# Patient Record
Sex: Female | Born: 1955 | Race: White | Hispanic: No | Marital: Married | Smoking: Former smoker
Health system: Southern US, Community
[De-identification: ages and names within clinical notes are randomized; demographics above are authoritative.]

## PROBLEM LIST (undated history)

## (undated) DIAGNOSIS — M8580 Other specified disorders of bone density and structure, unspecified site: Secondary | ICD-10-CM

## (undated) DIAGNOSIS — K635 Polyp of colon: Secondary | ICD-10-CM

## (undated) HISTORY — DX: Other specified disorders of bone density and structure, unspecified site: M85.80

## (undated) HISTORY — DX: Polyp of colon: K63.5

## (undated) HISTORY — PX: MOUTH SURGERY: SHX715

---
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
## 1998-02-18 ENCOUNTER — Other Ambulatory Visit: Admission: RE | Admit: 1998-02-18 | Discharge: 1998-02-18 | Payer: Self-pay | Admitting: Gynecology

## 1999-02-02 ENCOUNTER — Other Ambulatory Visit: Admission: RE | Admit: 1999-02-02 | Discharge: 1999-02-02 | Payer: Self-pay | Admitting: Gynecology

## 2000-06-29 ENCOUNTER — Other Ambulatory Visit: Admission: RE | Admit: 2000-06-29 | Discharge: 2000-06-29 | Payer: Self-pay | Admitting: Gynecology

## 2000-07-08 ENCOUNTER — Encounter: Admission: RE | Admit: 2000-07-08 | Discharge: 2000-07-08 | Payer: Self-pay | Admitting: Gynecology

## 2000-07-08 ENCOUNTER — Encounter: Payer: Self-pay | Admitting: Gynecology

## 2003-07-11 ENCOUNTER — Other Ambulatory Visit: Admission: RE | Admit: 2003-07-11 | Discharge: 2003-07-11 | Payer: Self-pay | Admitting: Gynecology

## 2003-07-26 ENCOUNTER — Encounter: Payer: Self-pay | Admitting: Gynecology

## 2003-07-26 ENCOUNTER — Encounter: Admission: RE | Admit: 2003-07-26 | Discharge: 2003-07-26 | Payer: Self-pay | Admitting: Gynecology

## 2008-10-25 DIAGNOSIS — M8580 Other specified disorders of bone density and structure, unspecified site: Secondary | ICD-10-CM

## 2008-10-25 HISTORY — DX: Other specified disorders of bone density and structure, unspecified site: M85.80

## 2009-03-06 ENCOUNTER — Encounter: Payer: Self-pay | Admitting: Women's Health

## 2009-03-06 ENCOUNTER — Other Ambulatory Visit: Admission: RE | Admit: 2009-03-06 | Discharge: 2009-03-06 | Payer: Self-pay | Admitting: Gynecology

## 2009-03-06 ENCOUNTER — Ambulatory Visit: Payer: Self-pay | Admitting: Women's Health

## 2009-03-25 DIAGNOSIS — K635 Polyp of colon: Secondary | ICD-10-CM | POA: Insufficient documentation

## 2009-03-25 HISTORY — DX: Polyp of colon: K63.5

## 2009-03-31 ENCOUNTER — Ambulatory Visit: Payer: Self-pay | Admitting: Women's Health

## 2009-09-15 ENCOUNTER — Encounter: Admission: RE | Admit: 2009-09-15 | Discharge: 2009-09-15 | Payer: Self-pay | Admitting: Gynecology

## 2009-09-25 ENCOUNTER — Encounter: Admission: RE | Admit: 2009-09-25 | Discharge: 2009-09-25 | Payer: Self-pay | Admitting: Gynecology

## 2009-10-25 HISTORY — PX: HERNIA REPAIR: SHX51

## 2010-02-06 ENCOUNTER — Encounter: Admission: RE | Admit: 2010-02-06 | Discharge: 2010-02-06 | Payer: Self-pay | Admitting: General Surgery

## 2010-02-11 ENCOUNTER — Ambulatory Visit (HOSPITAL_BASED_OUTPATIENT_CLINIC_OR_DEPARTMENT_OTHER): Admission: RE | Admit: 2010-02-11 | Discharge: 2010-02-11 | Payer: Self-pay | Admitting: General Surgery

## 2010-03-09 ENCOUNTER — Ambulatory Visit: Payer: Self-pay | Admitting: Women's Health

## 2010-03-09 ENCOUNTER — Other Ambulatory Visit: Admission: RE | Admit: 2010-03-09 | Discharge: 2010-03-09 | Payer: Self-pay | Admitting: Gynecology

## 2010-06-25 HISTORY — PX: BREAST SURGERY: SHX581

## 2010-06-30 ENCOUNTER — Encounter: Admission: RE | Admit: 2010-06-30 | Discharge: 2010-06-30 | Payer: Self-pay | Admitting: Specialist

## 2010-07-20 ENCOUNTER — Ambulatory Visit (HOSPITAL_BASED_OUTPATIENT_CLINIC_OR_DEPARTMENT_OTHER): Admission: RE | Admit: 2010-07-20 | Discharge: 2010-07-21 | Payer: Self-pay | Admitting: Specialist

## 2011-01-07 LAB — BASIC METABOLIC PANEL
Calcium: 8.9 mg/dL (ref 8.4–10.5)
Creatinine, Ser: 0.77 mg/dL (ref 0.4–1.2)

## 2011-01-07 LAB — DIFFERENTIAL
Basophils Absolute: 0.1 10*3/uL (ref 0.0–0.1)
Lymphocytes Relative: 23 % (ref 12–46)
Neutro Abs: 4.9 10*3/uL (ref 1.7–7.7)

## 2011-01-07 LAB — CBC
Platelets: 258 10*3/uL (ref 150–400)
RDW: 12.6 % (ref 11.5–15.5)
WBC: 7.5 10*3/uL (ref 4.0–10.5)

## 2011-01-12 LAB — DIFFERENTIAL
Eosinophils Absolute: 0.1 10*3/uL (ref 0.0–0.7)
Lymphocytes Relative: 32 % (ref 12–46)
Lymphs Abs: 1.6 10*3/uL (ref 0.7–4.0)
Monocytes Relative: 10 % (ref 3–12)
Neutrophils Relative %: 56 % (ref 43–77)

## 2011-01-12 LAB — BASIC METABOLIC PANEL
CO2: 28 mEq/L (ref 19–32)
Calcium: 8.8 mg/dL (ref 8.4–10.5)
Chloride: 106 mEq/L (ref 96–112)
Sodium: 139 mEq/L (ref 135–145)

## 2011-01-12 LAB — CBC
HCT: 42.8 % (ref 36.0–46.0)
MCV: 88.5 fL (ref 78.0–100.0)
RBC: 4.83 MIL/uL (ref 3.87–5.11)
WBC: 5.2 10*3/uL (ref 4.0–10.5)

## 2011-04-15 IMAGING — CR DG CHEST 2V
2 series · 2 of 2 positions shown · non-contrast
Comparison: None.

CLINICAL DATA: Preop respiratory exam.  Inguinal hernia.

CHEST - 2 VIEW

[w chest pa]
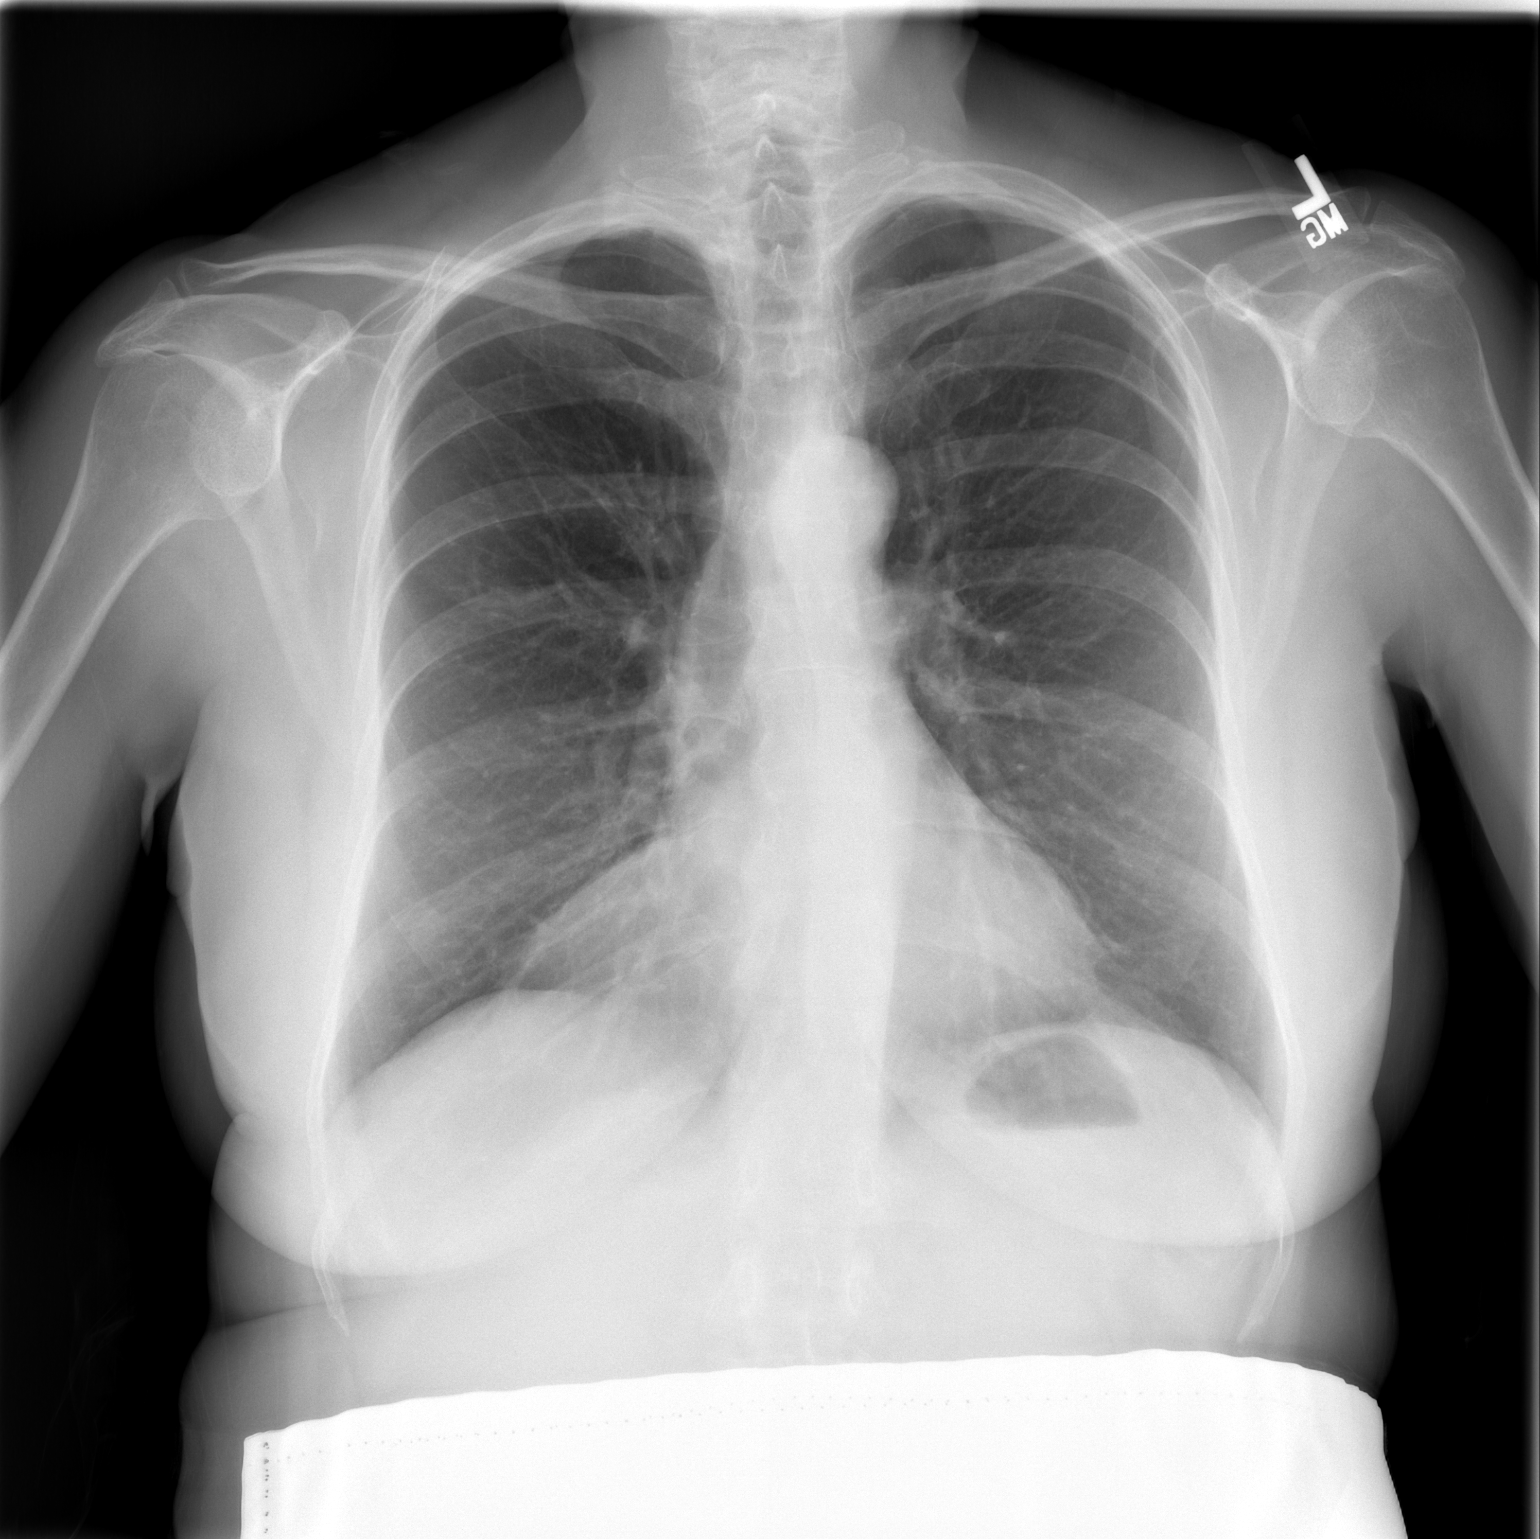

[w chest lat]
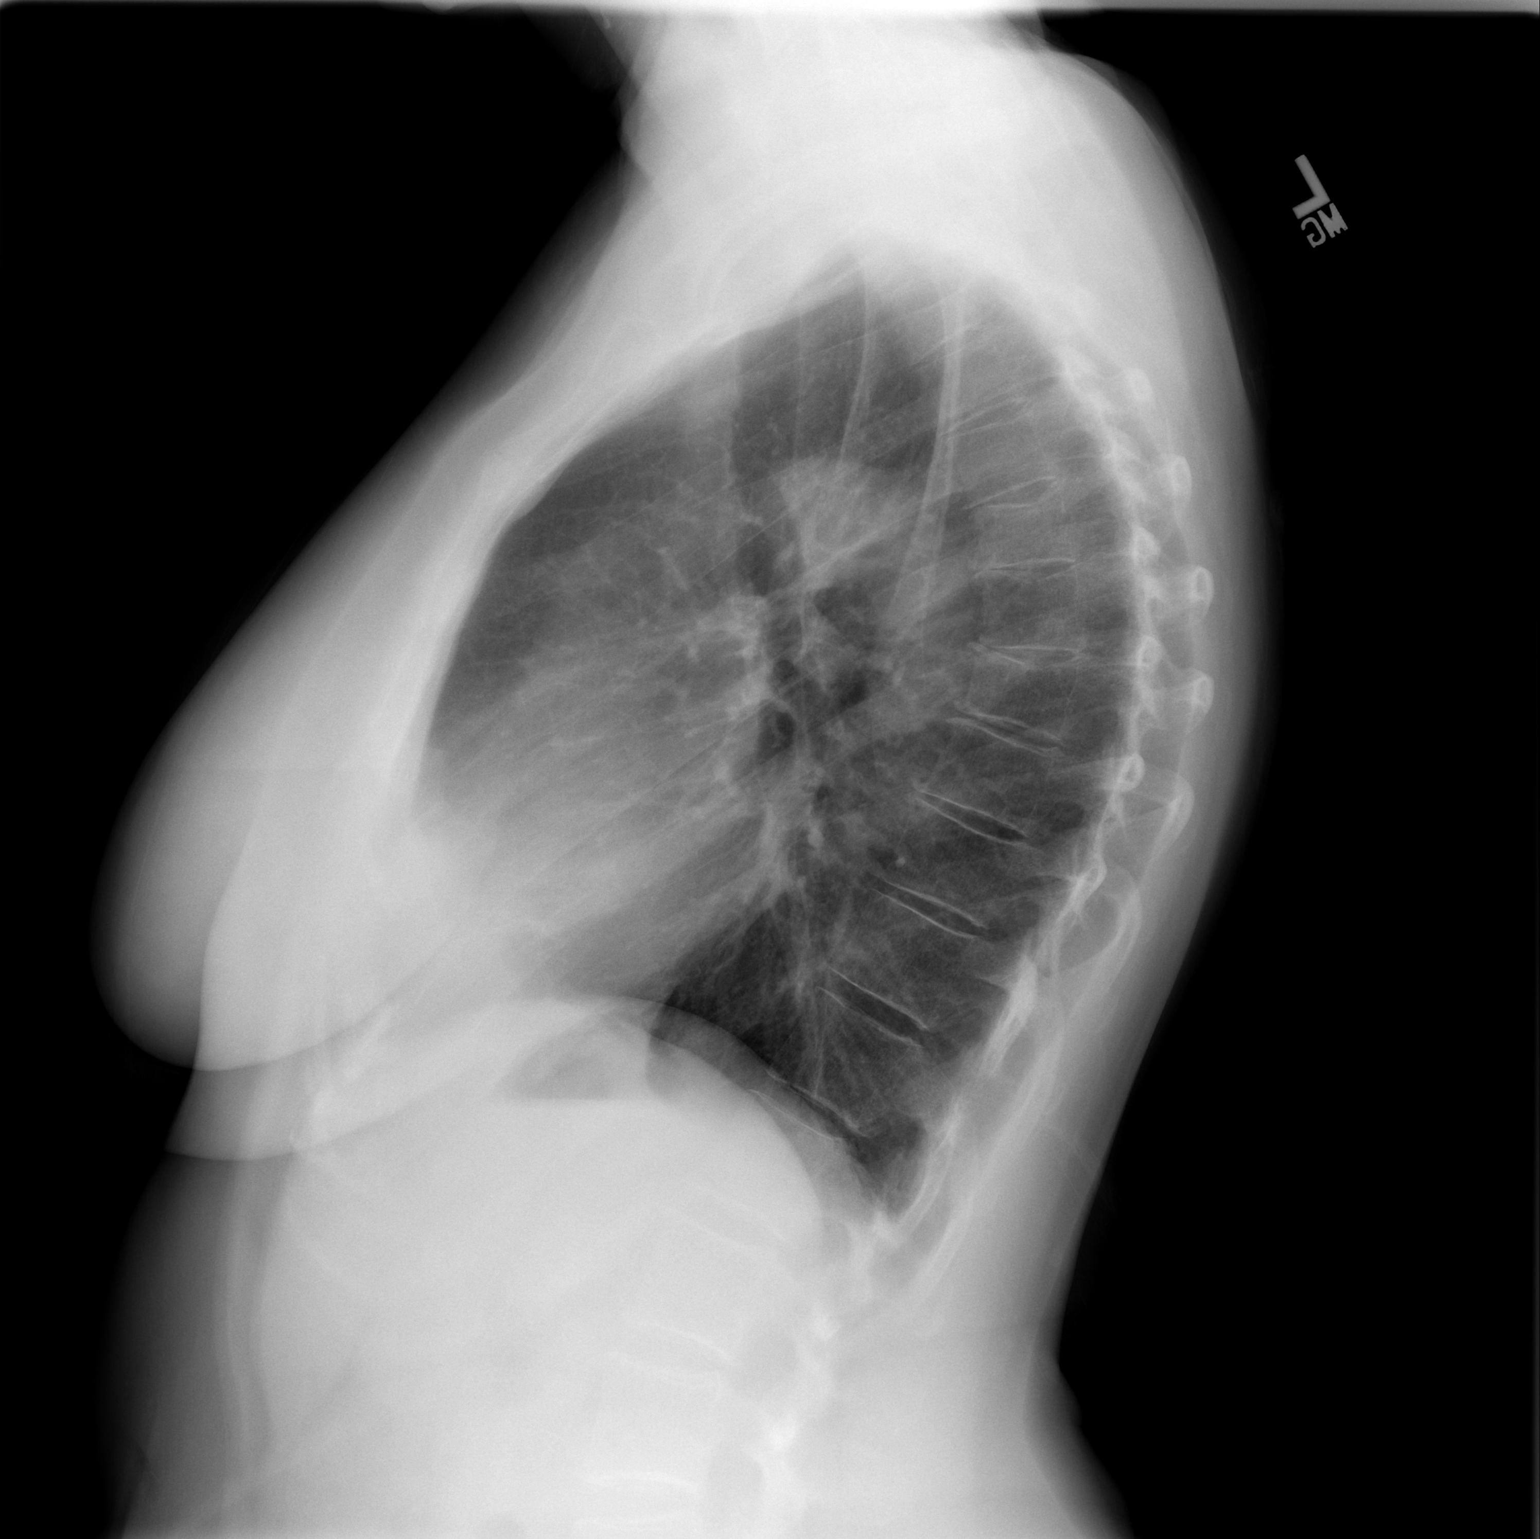

[2 of 2 positions shown; findings below may reference images not displayed]

FINDINGS: Heart size is normal.  There is soft tissue density in
the right cardiophrenic angle.  This may be a pericardial cyst or
epicardial fat pad.  Correlation with prior studies are suggested.

There is no heart failure.  There is no infiltrate or effusion.
IMPRESSION: Density in the right cardiophrenic angle is probably a benign
lesions such as a  or pericardial cyst or epicardial fat pad.  It
would be useful to confirm this with prior studies.  No acute
cardiopulmonary disease.

## 2011-07-21 ENCOUNTER — Encounter: Payer: Self-pay | Admitting: Women's Health

## 2011-07-21 ENCOUNTER — Other Ambulatory Visit (HOSPITAL_COMMUNITY)
Admission: RE | Admit: 2011-07-21 | Discharge: 2011-07-21 | Disposition: A | Discharge status: Home / Self Care | Payer: BC Managed Care – PPO | Source: Ambulatory Visit | Attending: Women's Health | Admitting: Women's Health

## 2011-07-21 ENCOUNTER — Ambulatory Visit (INDEPENDENT_AMBULATORY_CARE_PROVIDER_SITE_OTHER): Payer: BC Managed Care – PPO | Admitting: Women's Health

## 2011-07-21 VITALS — BP 120/70 | Ht 65.5 in | Wt 156.0 lb

## 2011-07-21 DIAGNOSIS — Z01419 Encounter for gynecological examination (general) (routine) without abnormal findings: Secondary | ICD-10-CM

## 2011-07-21 DIAGNOSIS — Z833 Family history of diabetes mellitus: Secondary | ICD-10-CM

## 2011-07-21 DIAGNOSIS — M8580 Other specified disorders of bone density and structure, unspecified site: Secondary | ICD-10-CM

## 2011-07-21 DIAGNOSIS — M949 Disorder of cartilage, unspecified: Secondary | ICD-10-CM

## 2011-07-21 DIAGNOSIS — M899 Disorder of bone, unspecified: Secondary | ICD-10-CM

## 2011-07-21 DIAGNOSIS — N951 Menopausal and female climacteric states: Secondary | ICD-10-CM

## 2011-07-21 MED ORDER — ESTRADIOL-NORETHINDRONE ACET 0.05-0.14 MG/DAY TD PTTW: 1.0000 | MEDICATED_PATCH | TRANSDERMAL | Status: DC

## 2011-07-21 NOTE — Progress Notes (Signed)
Sabrina Lee Ord 01/12/1956 [id number]    History:    The patient presents for annual exam.  Works at [employer].   Past medical history, past surgical history, family history and social history were all reviewed and documented in the EPIC chart.   ROS:  A  ROS was performed and pertinent positives and negatives are included in the history.  Exam:  Filed Vitals:   07/21/11 1501  BP: 120/70    General appearance:  Normal Head/Neck:  Normal, without cervical or supraclavicular adenopathy. Thyroid:  Symmetrical, normal in size, without palpable masses or nodularity. Respiratory  Effort:  Normal  Auscultation:  Clear without wheezing or rhonchi Cardiovascular  Auscultation:  Regular rate, without rubs, murmurs or gallops  Edema/varicosities:  Not grossly evident Abdominal  Soft,nontender, without masses, guarding or rebound.  Liver/spleen:  No organomegaly noted  Hernia:  None appreciated  Skin  Inspection:  Grossly normal  Palpation:  Grossly normal Neurologic/psychiatric  Orientation:  Normal with appropriate conversation.  Mood/affect:  Normal  Genitourinary    Breasts: Examined lying and sitting.     Right: Without masses, retractions, discharge or axillary adenopathy.     Left: Without masses, retractions, discharge or axillary adenopathy.   Inguinal/mons:  Normal without inguinal adenopathy  External genitalia:  Normal  BUS/Urethra/Skene's glands:  Normal  Bladder:  Normal  Vagina:  Normal  Cervix:  Normal  Uterus:   normal in size, shape and contour.  Midline and mobile  Adnexa/parametria:     Rt: Without masses or tenderness.   Lt: Without masses or tenderness.  Anus and perineum: Normal  Digital rectal exam: Normal sphincter tone without palpated masses or tenderness  Assessment/Plan:  55 y.o. MWF G2P2  for annual exam. Combipatch 50/140 biw  with occasional hot flushes with no bleeding. Had negative polyps on a colonoscopy in 2010, is aware to  have that repeated in 3 years. She had a breast reduction last year and states has felt much better with less back pain since then. DEXA in 2010 -2.4 T score at the spine hip average was a -1.4.  Postmenopausal on CombiPatch and osteopenia  Plan: Options were reviewed  to change CombiPatch 2 other products, would prefer to continue with the CombiPatch. Prescription proper use, risk for blood clots, strokes, and breast cancer were reviewed, had a DEXA in June of 2010 will get scheduled prior to leaving the office today did review importance of calcium rich diet, fall prevention discussed. Weightbearing exercises were encouraged which she is doing and will continue. Vitamin D 2000 daily encouraged. SBEs, annual mammogram, will schedule. Will check a CBC, glucose, vitamin D, UA and Pap  Harrington Challenger WHNP, 4:59 PM 07/21/2011

## 2011-08-03 ENCOUNTER — Other Ambulatory Visit: Payer: Self-pay | Admitting: Women's Health

## 2011-08-03 DIAGNOSIS — Z1231 Encounter for screening mammogram for malignant neoplasm of breast: Secondary | ICD-10-CM

## 2011-08-23 ENCOUNTER — Ambulatory Visit
Admission: RE | Admit: 2011-08-23 | Discharge: 2011-08-23 | Disposition: A | Discharge status: Home / Self Care | Payer: BC Managed Care – PPO | Source: Ambulatory Visit | Attending: Women's Health | Admitting: Women's Health

## 2011-08-23 DIAGNOSIS — Z1231 Encounter for screening mammogram for malignant neoplasm of breast: Secondary | ICD-10-CM

## 2011-08-25 ENCOUNTER — Other Ambulatory Visit: Payer: Self-pay | Admitting: Gynecology

## 2011-08-25 ENCOUNTER — Ambulatory Visit (INDEPENDENT_AMBULATORY_CARE_PROVIDER_SITE_OTHER): Payer: BC Managed Care – PPO

## 2011-08-25 DIAGNOSIS — M8580 Other specified disorders of bone density and structure, unspecified site: Secondary | ICD-10-CM

## 2011-08-25 DIAGNOSIS — M899 Disorder of bone, unspecified: Secondary | ICD-10-CM

## 2012-09-14 ENCOUNTER — Ambulatory Visit (INDEPENDENT_AMBULATORY_CARE_PROVIDER_SITE_OTHER): Payer: 59 | Admitting: Women's Health

## 2012-09-14 ENCOUNTER — Encounter: Payer: Self-pay | Admitting: Women's Health

## 2012-09-14 VITALS — BP 122/80 | Ht 65.25 in | Wt 153.0 lb

## 2012-09-14 DIAGNOSIS — K635 Polyp of colon: Secondary | ICD-10-CM

## 2012-09-14 DIAGNOSIS — M8580 Other specified disorders of bone density and structure, unspecified site: Secondary | ICD-10-CM

## 2012-09-14 DIAGNOSIS — D126 Benign neoplasm of colon, unspecified: Secondary | ICD-10-CM

## 2012-09-14 DIAGNOSIS — Z13220 Encounter for screening for lipoid disorders: Secondary | ICD-10-CM

## 2012-09-14 DIAGNOSIS — M949 Disorder of cartilage, unspecified: Secondary | ICD-10-CM

## 2012-09-14 DIAGNOSIS — E079 Disorder of thyroid, unspecified: Secondary | ICD-10-CM

## 2012-09-14 DIAGNOSIS — M899 Disorder of bone, unspecified: Secondary | ICD-10-CM

## 2012-09-14 DIAGNOSIS — Z01419 Encounter for gynecological examination (general) (routine) without abnormal findings: Secondary | ICD-10-CM

## 2012-09-14 DIAGNOSIS — Z833 Family history of diabetes mellitus: Secondary | ICD-10-CM

## 2012-09-14 LAB — CBC WITH DIFFERENTIAL/PLATELET
Eosinophils Relative: 3 % (ref 0–5)
HCT: 42.5 % (ref 36.0–46.0)
Hemoglobin: 14.2 g/dL (ref 12.0–15.0)
Lymphocytes Relative: 29 % (ref 12–46)
Lymphs Abs: 1.9 10*3/uL (ref 0.7–4.0)
MCV: 83.8 fL (ref 78.0–100.0)
Monocytes Absolute: 0.7 10*3/uL (ref 0.1–1.0)
Monocytes Relative: 11 % (ref 3–12)
RBC: 5.07 MIL/uL (ref 3.87–5.11)
RDW: 13.3 % (ref 11.5–15.5)
WBC: 6.4 10*3/uL (ref 4.0–10.5)

## 2012-09-14 LAB — LIPID PANEL
Cholesterol: 250 mg/dL — ABNORMAL HIGH (ref 0–200)
HDL: 67 mg/dL (ref >39)
LDL Cholesterol: 154 mg/dL — ABNORMAL HIGH (ref 0–99)
Total CHOL/HDL Ratio: 3.7 Ratio
Triglycerides: 143 mg/dL (ref <150)
VLDL: 29 mg/dL (ref 0–40)

## 2012-09-14 NOTE — Patient Instructions (Addendum)

## 2012-09-14 NOTE — Assessment & Plan Note (Signed)
T score of -2.4 at AP spine, bilateral hip average -1.3 /no increased fracture risk October 2012

## 2012-09-14 NOTE — Assessment & Plan Note (Signed)
Benign polyps June 2010

## 2012-09-14 NOTE — Progress Notes (Signed)
Sabrina Lee [birth year] [id number]    History:    The patient presents for annual exam.  Postmenopausal/no bleeding, has been gradually weaning off CombiPatch and plans to quit this year. History of normal Paps and mammograms. Benign colon polyps June 2010. Quit smoking 2007.  Osteopenia T score -2.4 at AP spine bilateral hip average T score -1.3 (07/2011), with no increased fracture risk.   Past medical history, past surgical history, family history and social history were all reviewed and documented in the EPIC chart. Works at [employer] part time. Daughter Sabrina Lee and son are doing well.   ROS:  A  ROS was performed and pertinent positives and negatives are included in the history.  Exam:  Filed Vitals:   09/14/12 1538  BP: 122/80    General appearance:  Normal Head/Neck:  Normal, without cervical or supraclavicular adenopathy. Thyroid:  Symmetrical, normal in size, without palpable masses or nodularity. Respiratory  Effort:  Normal  Auscultation:  Clear without wheezing or rhonchi Cardiovascular  Auscultation:  Regular rate, without rubs, murmurs or gallops  Edema/varicosities:  Not grossly evident Abdominal  Soft,nontender, without masses, guarding or rebound.  Liver/spleen:  No organomegaly noted  Hernia:  None appreciated  Skin  Inspection:  Grossly normal  Palpation:  Grossly normal Neurologic/psychiatric  Orientation:  Normal with appropriate conversation.  Mood/affect:  Normal  Genitourinary    Breasts: Examined lying and sitting/ breast reduction.     Right: Without masses, retractions, discharge or axillary adenopathy.     Left: Without masses, retractions, discharge or axillary adenopathy.   Inguinal/mons:  Normal without inguinal adenopathy  External genitalia:  Normal  BUS/Urethra/Skene's glands:  Normal  Bladder:  Normal  Vagina:  Normal  Cervix:  Normal  Uterus:   normal in size, shape and contour.  Midline and mobile  Adnexa/parametria:       Rt: Without masses or tenderness.   Lt: Without masses or tenderness.  Anus and perineum: Normal  Digital rectal exam: Normal sphincter tone without palpated masses or tenderness  Assessment/Plan:  56 y.o. M. WF G2 P2  for annual exam with no complaints.  Normal GYN exam on CombiPatch Osteopenia T score -2.4 at spine October 2012  Plan: SBE's, continue annual mammogram, calcium rich diet, vitamin D 2000 daily and regular exercise encouraged. Home safety and fall prevention discussed. CombiPatch 50/140  proper use, slight risk for blood clots, strokes, breast cancer reviewed, is in the process of tapering off. Will call if needs a prescription. CBC, glucose, lipid panel, TSH, UA. Home Hemoccult card given. Pap normal 2012, new screening guidelines reviewed.    Sabrina Lee [facility], 4:32 PM 09/14/2012

## 2012-09-15 ENCOUNTER — Other Ambulatory Visit: Payer: Self-pay | Admitting: Women's Health

## 2012-09-15 DIAGNOSIS — E7800 Pure hypercholesterolemia, unspecified: Secondary | ICD-10-CM

## 2012-09-15 LAB — URINALYSIS W MICROSCOPIC + REFLEX CULTURE
Bilirubin Urine: NEGATIVE
Hgb urine dipstick: NEGATIVE
Ketones, ur: NEGATIVE mg/dL
Nitrite: NEGATIVE
Urobilinogen, UA: 0.2 mg/dL (ref 0.0–1.0)

## 2012-09-15 LAB — TSH: TSH: 1.504 u[IU]/mL (ref 0.350–4.500)

## 2012-12-09 ENCOUNTER — Other Ambulatory Visit: Payer: Self-pay

## 2013-08-30 ENCOUNTER — Other Ambulatory Visit: Payer: Self-pay

## 2013-09-19 ENCOUNTER — Encounter: Payer: Self-pay | Admitting: Women's Health

## 2013-09-19 ENCOUNTER — Ambulatory Visit (INDEPENDENT_AMBULATORY_CARE_PROVIDER_SITE_OTHER): Payer: 59 | Admitting: Women's Health

## 2013-09-19 ENCOUNTER — Other Ambulatory Visit (HOSPITAL_COMMUNITY)
Admission: RE | Admit: 2013-09-19 | Discharge: 2013-09-19 | Disposition: A | Discharge status: Home / Self Care | Payer: 59 | Source: Ambulatory Visit | Attending: Gynecology | Admitting: Gynecology

## 2013-09-19 VITALS — BP 112/70 | Ht 65.0 in | Wt 155.0 lb

## 2013-09-19 DIAGNOSIS — M899 Disorder of bone, unspecified: Secondary | ICD-10-CM

## 2013-09-19 DIAGNOSIS — M8580 Other specified disorders of bone density and structure, unspecified site: Secondary | ICD-10-CM

## 2013-09-19 DIAGNOSIS — Z13220 Encounter for screening for lipoid disorders: Secondary | ICD-10-CM

## 2013-09-19 DIAGNOSIS — Z01419 Encounter for gynecological examination (general) (routine) without abnormal findings: Secondary | ICD-10-CM

## 2013-09-19 DIAGNOSIS — Z23 Encounter for immunization: Secondary | ICD-10-CM

## 2013-09-19 LAB — URINALYSIS W MICROSCOPIC + REFLEX CULTURE
Bacteria, UA: NONE SEEN
Bilirubin Urine: NEGATIVE
Crystals: NONE SEEN
Ketones, ur: NEGATIVE mg/dL
Nitrite: NEGATIVE
Protein, ur: NEGATIVE mg/dL
Specific Gravity, Urine: 1.019 (ref 1.005–1.030)
Squamous Epithelial / LPF: NONE SEEN
Urobilinogen, UA: 0.2 mg/dL (ref 0.0–1.0)

## 2013-09-19 LAB — LIPID PANEL
Cholesterol: 237 mg/dL — ABNORMAL HIGH (ref 0–200)
HDL: 83 mg/dL (ref >39)
LDL Cholesterol: 143 mg/dL — ABNORMAL HIGH (ref 0–99)
Total CHOL/HDL Ratio: 2.9 Ratio
Triglycerides: 54 mg/dL (ref <150)
VLDL: 11 mg/dL (ref 0–40)

## 2013-09-19 LAB — CBC WITH DIFFERENTIAL/PLATELET
Basophils Relative: 1 % (ref 0–1)
HCT: 44 % (ref 36.0–46.0)
Hemoglobin: 14.7 g/dL (ref 12.0–15.0)
Lymphocytes Relative: 39 % (ref 12–46)
Lymphs Abs: 1.8 10*3/uL (ref 0.7–4.0)
MCHC: 33.4 g/dL (ref 30.0–36.0)
Monocytes Absolute: 0.5 10*3/uL (ref 0.1–1.0)
Monocytes Relative: 10 % (ref 3–12)
Neutro Abs: 2.2 10*3/uL (ref 1.7–7.7)
Neutrophils Relative %: 48 % (ref 43–77)
RBC: 5.31 MIL/uL — ABNORMAL HIGH (ref 3.87–5.11)

## 2013-09-19 NOTE — Patient Instructions (Signed)
Health Recommendations for Postmenopausal Women Respected and ongoing research has looked at the most common causes of death, disability, and poor quality of life in postmenopausal women. The causes include heart disease, diseases of blood vessels, diabetes, depression, cancer, and bone loss (osteoporosis). Many things can be done to help lower the chances of developing these and other common problems: CARDIOVASCULAR DISEASE Heart Disease: A heart attack is a medical emergency. Know the signs and symptoms of a heart attack. Below are things women can do to reduce their risk for heart disease.   Do not smoke. If you smoke, quit.  Aim for a healthy weight. Being overweight causes many preventable deaths. Eat a healthy and balanced diet and drink an adequate amount of liquids.  Get moving. Make a commitment to be more physically active. Aim for 30 minutes of activity on most, if not all days of the week.  Eat for heart health. Choose a diet that is low in saturated fat and cholesterol and eliminate trans fat. Include whole grains, vegetables, and fruits. Read and understand the labels on food containers before buying.  Know your numbers. Ask your caregiver to check your blood pressure, cholesterol (total, HDL, LDL, triglycerides) and blood glucose. Work with your caregiver on improving your entire clinical picture.  High blood pressure. Limit or stop your table salt intake (try salt substitute and food seasonings). Avoid salty foods and drinks. Read labels on food containers before buying. Eating well and exercising can help control high blood pressure. STROKE  Stroke is a medical emergency. Stroke may be the result of a blood clot in a blood vessel in the brain or by a brain hemorrhage (bleeding). Know the signs and symptoms of a stroke. To lower the risk of developing a stroke:  Avoid fatty foods.  Quit smoking.  Control your diabetes, blood pressure, and irregular heart rate. THROMBOPHLEBITIS  (BLOOD CLOT) OF THE LEG  Becoming overweight and leading a stationary lifestyle may also contribute to developing blood clots. Controlling your diet and exercising will help lower the risk of developing blood clots. CANCER SCREENING  Breast Cancer: Take steps to reduce your risk of breast cancer.  You should practice "breast self-awareness." This means understanding the normal appearance and feel of your breasts and should include breast self-examination. Any changes detected, no matter how small, should be reported to your caregiver.  After age 40, you should have a clinical breast exam (CBE) every year.  Starting at age 40, you should consider having a mammogram (breast X-ray) every year.  If you have a family history of breast cancer, talk to your caregiver about genetic screening.  If you are at high risk for breast cancer, talk to your caregiver about having an MRI and a mammogram every year.  Intestinal or Stomach Cancer: Tests to consider are a rectal exam, fecal occult blood, sigmoidoscopy, and colonoscopy. Women who are high risk may need to be screened at an earlier age and more often.  Cervical Cancer:  Beginning at age 30, you should have a Pap test every 3 years as long as the past 3 Pap tests have been normal.  If you have had past treatment for cervical cancer or a condition that could lead to cancer, you need Pap tests and screening for cancer for at least 20 years after your treatment.  If you had a hysterectomy for a problem that was not cancer or a condition that could lead to cancer, then you no longer need Pap tests.    If you are between ages 65 and 70, and you have had normal Pap tests going back 10 years, you no longer need Pap tests.  If Pap tests have been discontinued, risk factors (such as a new sexual partner) need to be reassessed to determine if screening should be resumed.  Some medical problems can increase the chance of getting cervical cancer. In these  cases, your caregiver may recommend more frequent screening and Pap tests.  Uterine Cancer: If you have vaginal bleeding after reaching menopause, you should notify your caregiver.  Ovarian cancer: Other than yearly pelvic exams, there are no reliable tests available to screen for ovarian cancer at this time except for yearly pelvic exams.  Lung Cancer: Yearly chest X-rays can detect lung cancer and should be done on high risk women, such as cigarette smokers and women with chronic lung disease (emphysema).  Skin Cancer: A complete body skin exam should be done at your yearly examination. Avoid overexposure to the sun and ultraviolet light lamps. Use a strong sun block cream when in the sun. All of these things are important in lowering the risk of skin cancer. MENOPAUSE Menopause Symptoms: Hormone therapy products are effective for treating symptoms associated with menopause:  Moderate to severe hot flashes.  Night sweats.  Mood swings.  Headaches.  Tiredness.  Loss of sex drive.  Insomnia.  Other symptoms. Hormone replacement carries certain risks, especially in older women. Women who use or are thinking about using estrogen or estrogen with progestin treatments should discuss that with their caregiver. Your caregiver will help you understand the benefits and risks. The ideal dose of hormone replacement therapy is not known. The Food and Drug Administration (FDA) has concluded that hormone therapy should be used only at the lowest doses and for the shortest amount of time to reach treatment goals.  OSTEOPOROSIS Protecting Against Bone Loss and Preventing Fracture: If you use hormone therapy for prevention of bone loss (osteoporosis), the risks for bone loss must outweigh the risk of the therapy. Ask your caregiver about other medications known to be safe and effective for preventing bone loss and fractures. To guard against bone loss or fractures, the following is recommended:  If  you are less than age 50, take 1000 mg of calcium and at least 600 mg of Vitamin D per day.  If you are greater than age 50 but less than age 70, take 1200 mg of calcium and at least 600 mg of Vitamin D per day.  If you are greater than age 70, take 1200 mg of calcium and at least 800 mg of Vitamin D per day. Smoking and excessive alcohol intake increases the risk of osteoporosis. Eat foods rich in calcium and vitamin D and do weight bearing exercises several times a week as your caregiver suggests. DIABETES Diabetes Melitus: If you have Type I or Type 2 diabetes, you should keep your blood sugar under control with diet, exercise and recommended medication. Avoid too many sweets, starchy and fatty foods. Being overweight can make control more difficult. COGNITION AND MEMORY Cognition and Memory: Menopausal hormone therapy is not recommended for the prevention of cognitive disorders such as Alzheimer's disease or memory loss.  DEPRESSION  Depression may occur at any age, but is common in elderly women. The reasons may be because of physical, medical, social (loneliness), or financial problems and needs. If you are experiencing depression because of medical problems and control of symptoms, talk to your caregiver about this. Physical activity and   exercise may help with mood and sleep. Community and volunteer involvement may help your sense of value and worth. If you have depression and you feel that the problem is getting worse or becoming severe, talk to your caregiver about treatment options that are best for you. ACCIDENTS  Accidents are common and can be serious in the elderly woman. Prepare your house to prevent accidents. Eliminate throw rugs, place hand bars in the bath, shower and toilet areas. Avoid wearing high heeled shoes or walking on wet, snowy, and icy areas. Limit or stop driving if you have vision or hearing problems, or you feel you are unsteady with you movements and  reflexes. HEPATITIS C Hepatitis C is a type of viral infection affecting the liver. It is spread mainly through contact with blood from an infected person. It can be treated, but if left untreated, it can lead to severe liver damage over years. Many people who are infected do not know that the virus is in their blood. If you are a "baby-boomer", it is recommended that you have one screening test for Hepatitis C. IMMUNIZATIONS  Several immunizations are important to consider having during your senior years, including:   Tetanus, diptheria, and pertussis booster shot.  Influenza every year before the flu season begins.  Pneumonia vaccine.  Shingles vaccine.  Others as indicated based on your specific needs. Talk to your caregiver about these. Document Released: 12/03/2005 Document Revised: 09/27/2012 Document Reviewed: 07/29/2008 [employer] Patient Information 2014 [employer].  

## 2013-09-19 NOTE — Progress Notes (Signed)
Sabrina Lee December 26, 1955 [id number]    History:    The patient presents for annual exam.  Postmenopausal/no bleeding/no HRT.  07/2011  DEXA T score -2.4 at spine -1.3 hip, FRAX 12%/0.5%. 2010 negative: Polyps on colonoscopy. Normal Pap and mammogram history.   Past medical history, past surgical history, family history and social history were all reviewed and documented in the EPIC chart. Works part-time at [employer]. Daughter Sabrina Lee had a baby in July 26, 2023 doing well. Mother died of heart disease at age 81. Father mental illness.   ROS:  A  ROS was performed and pertinent positives and negatives are included in the history.  Exam:  Filed Vitals:   09/19/13 0843  BP: 112/70    General appearance:  Normal Head/Neck:  Normal, without cervical or supraclavicular adenopathy. Thyroid:  Symmetrical, normal in size, without palpable masses or nodularity. Respiratory  Effort:  Normal  Auscultation:  Clear without wheezing or rhonchi Cardiovascular  Auscultation:  Regular rate, without rubs, murmurs or gallops  Edema/varicosities:  Not grossly evident Abdominal  Soft,nontender, without masses, guarding or rebound.  Liver/spleen:  No organomegaly noted  Hernia:  None appreciated  Skin  Inspection:  Grossly normal  Palpation:  Grossly normal Neurologic/psychiatric  Orientation:  Normal with appropriate conversation.  Mood/affect:  Normal  Genitourinary    Breasts: Examined lying and sitting.     Right: Without masses, retractions, discharge or axillary adenopathy.     Left: Without masses, retractions, discharge or axillary adenopathy.   Inguinal/mons:  Normal without inguinal adenopathy  External genitalia:  Normal  BUS/Urethra/Skene's glands:  Normal  Bladder:  Normal  Vagina:  Normal  Cervix:  Normal  Uterus:   normal in size, shape and contour.  Midline and mobile  Adnexa/parametria:     Rt: Without masses or tenderness.   Lt: Without masses or  tenderness.  Anus and perineum: Normal  Digital rectal exam: Normal sphincter tone without palpated masses or tenderness  Assessment/Plan:  57 y.o. MWF G2P2 for annual exam with no complaints.  Osteopenia Colon polyps 2010 Postmenopausal/no bleeding/no HRT  Plan: Scheduled repeat colonoscopy 2015. Repeat DEXA, reviewed importance of regular exercise, calcium rich diet, vitamin D 2000 daily. Home safety and fall prevention discussed. Tdap given today. SBE's, schedule annual mammogram overdue, reviewed importance of annual screen. CBC, lipid panel, UA, Pap. Pap normal 2012, new screening guidelines reviewed.    Sabrina Lee WHNP, 10:12 AM 09/19/2013

## 2013-11-12 ENCOUNTER — Other Ambulatory Visit: Payer: Self-pay | Admitting: Gynecology

## 2013-11-12 DIAGNOSIS — M8580 Other specified disorders of bone density and structure, unspecified site: Secondary | ICD-10-CM

## 2013-11-28 ENCOUNTER — Encounter: Payer: Self-pay | Admitting: Family Medicine

## 2013-11-29 ENCOUNTER — Other Ambulatory Visit: Payer: Self-pay | Admitting: Gynecology

## 2013-11-29 ENCOUNTER — Ambulatory Visit (INDEPENDENT_AMBULATORY_CARE_PROVIDER_SITE_OTHER): Payer: 59

## 2013-11-29 DIAGNOSIS — M810 Age-related osteoporosis without current pathological fracture: Secondary | ICD-10-CM

## 2013-11-29 DIAGNOSIS — M8580 Other specified disorders of bone density and structure, unspecified site: Secondary | ICD-10-CM

## 2013-12-03 ENCOUNTER — Other Ambulatory Visit: Payer: Self-pay | Admitting: *Deleted

## 2013-12-03 DIAGNOSIS — M810 Age-related osteoporosis without current pathological fracture: Secondary | ICD-10-CM

## 2013-12-04 ENCOUNTER — Other Ambulatory Visit: Payer: 59

## 2013-12-06 ENCOUNTER — Other Ambulatory Visit: Payer: 59

## 2013-12-06 DIAGNOSIS — M810 Age-related osteoporosis without current pathological fracture: Secondary | ICD-10-CM

## 2013-12-07 ENCOUNTER — Other Ambulatory Visit: Payer: 59

## 2013-12-07 LAB — PTH, INTACT AND CALCIUM
CALCIUM: 9.3 mg/dL (ref 8.4–10.5)
PTH: 44.8 pg/mL (ref 14.0–72.0)

## 2013-12-07 LAB — VITAMIN D 25 HYDROXY (VIT D DEFICIENCY, FRACTURES): Vit D, 25-Hydroxy: 63 ng/mL (ref 30–89)

## 2013-12-17 ENCOUNTER — Ambulatory Visit (INDEPENDENT_AMBULATORY_CARE_PROVIDER_SITE_OTHER): Payer: 59 | Admitting: Gynecology

## 2013-12-17 ENCOUNTER — Encounter: Payer: Self-pay | Admitting: Gynecology

## 2013-12-17 DIAGNOSIS — M810 Age-related osteoporosis without current pathological fracture: Secondary | ICD-10-CM

## 2013-12-17 NOTE — Patient Instructions (Addendum)
Osteoporosis Throughout your life, your body breaks down old bone and replaces it with new bone. As you get older, your body does not replace bone as quickly as it breaks it down. By the age of 30 years, most people begin to gradually lose bone because of the imbalance between bone loss and replacement. Some people lose more bone than others. Bone loss beyond a specified normal degree is considered osteoporosis.  Osteoporosis affects the strength and durability of your bones. The inside of the ends of your bones and your flat bones, like the bones of your pelvis, look like honeycomb, filled with tiny open spaces. As bone loss occurs, your bones become less dense. This means that the open spaces inside your bones become bigger and the walls between these spaces become thinner. This makes your bones weaker. Bones of a person with osteoporosis can become so weak that they can break (fracture) during minor accidents, such as a simple fall. CAUSES  The following factors have been associated with the development of osteoporosis:  Smoking.  Drinking more than 2 alcoholic drinks several days per week.  Long-term use of certain medicines:  Corticosteroids.  Chemotherapy medicines.  Thyroid medicines.  Antiepileptic medicines.  Gonadal hormone suppression medicine.  Immunosuppression medicine.  Being underweight.  Lack of physical activity.  Lack of exposure to the sun. This can lead to vitamin D deficiency.  Certain medical conditions:  Certain inflammatory bowel diseases, such as Crohn disease and ulcerative colitis.  Diabetes.  Hyperthyroidism.  Hyperparathyroidism. RISK FACTORS Anyone can develop osteoporosis. However, the following factors can increase your risk of developing osteoporosis:  Gender Women are at higher risk than men.  Age Being older than 50 years increases your risk.  Ethnicity White and Asian people have an increased risk.  Weight Being extremely  underweight can increase your risk of osteoporosis.  Family history of osteoporosis Having a family member who has developed osteoporosis can increase your risk. SYMPTOMS  Usually, people with osteoporosis have no symptoms.  DIAGNOSIS  Signs during a physical exam that may prompt your caregiver to suspect osteoporosis include:  Decreased height. This is usually caused by the compression of the bones that form your spine (vertebrae) because they have weakened and become fractured.  A curving or rounding of the upper back (kyphosis). To confirm signs of osteoporosis, your caregiver may request a procedure that uses 2 low-dose X-ray beams with different levels of energy to measure your bone mineral density (dual-energy X-ray absorptiometry [DXA]). Also, your caregiver may check your level of vitamin D. TREATMENT  The goal of osteoporosis treatment is to strengthen bones in order to decrease the risk of bone fractures. There are different types of medicines available to help achieve this goal. Some of these medicines work by slowing the processes of bone loss. Some medicines work by increasing bone density. Treatment also involves making sure that your levels of calcium and vitamin D are adequate. PREVENTION  There are things you can do to help prevent osteoporosis. Adequate intake of calcium and vitamin D can help you achieve optimal bone mineral density. Regular exercise can also help, especially resistance and weight-bearing activities. If you smoke, quitting smoking is an important part of osteoporosis prevention. MAKE SURE YOU:  Understand these instructions.  Will watch your condition.  Will get help right away if you are not doing well or get worse. FOR MORE INFORMATION www.osteo.org and www.nof.org Document Released: 07/21/2005 Document Revised: 02/05/2013 Document Reviewed: 09/25/2011 Exit[employer] Patient Information 2014 [employer].     Denosumab injection °What is this  medicine? °DENOSUMAB (den oh sue mab) slows bone breakdown. Prolia is used to treat osteoporosis in women after menopause and in men. Xgeva is used to prevent bone fractures and other bone problems caused by cancer bone metastases. Xgeva is also used to treat giant cell tumor of the bone. °This medicine may be used for other purposes; ask your health care provider or pharmacist if you have questions. °COMMON BRAND NAME(S): Prolia, XGEVA °What should I tell my health care provider before I take this medicine? °They need to know if you have any of these conditions: °-dental disease °-eczema °-infection or history of infections °-kidney disease or on dialysis °-low blood calcium or vitamin D °-malabsorption syndrome °-scheduled to have surgery or tooth extraction °-taking medicine that contains denosumab °-thyroid or parathyroid disease °-an unusual reaction to denosumab, other medicines, foods, dyes, or preservatives °-pregnant or trying to get pregnant °-breast-feeding °How should I use this medicine? °This medicine is for injection under the skin. It is given by a health care professional in a hospital or clinic setting. °If you are getting Prolia, a special MedGuide will be given to you by the pharmacist with each prescription and refill. Be sure to read this information carefully each time. °For Prolia, talk to your pediatrician regarding the use of this medicine in children. Special care may be needed. For Xgeva, talk to your pediatrician regarding the use of this medicine in children. While this drug may be prescribed for children as young as 13 years for selected conditions, precautions do apply. °Overdosage: If you think you've taken too much of this medicine contact a poison control center or emergency room at once. °Overdosage: If you think you have taken too much of this medicine contact a poison control center or emergency room at once. °NOTE: This medicine is only for you. Do not share this medicine with  others. °What if I miss a dose? °It is important not to miss your dose. Call your doctor or health care professional if you are unable to keep an appointment. °What may interact with this medicine? °Do not take this medicine with any of the following medications: °-other medicines containing denosumab °This medicine may also interact with the following medications: °-medicines that suppress the immune system °-medicines that treat cancer °-steroid medicines like prednisone or cortisone °This list may not describe all possible interactions. Give your health care provider a list of all the medicines, herbs, non-prescription drugs, or dietary supplements you use. Also tell them if you smoke, drink alcohol, or use illegal drugs. Some items may interact with your medicine. °What should I watch for while using this medicine? °Visit your doctor or health care professional for regular checks on your progress. Your doctor or health care professional may order blood tests and other tests to see how you are doing. °Call your doctor or health care professional if you get a cold or other infection while receiving this medicine. Do not treat yourself. This medicine may decrease your body's ability to fight infection. °You should make sure you get enough calcium and vitamin D while you are taking this medicine, unless your doctor tells you not to. Discuss the foods you eat and the vitamins you take with your health care professional. °See your dentist regularly. Brush and floss your teeth as directed. Before you have any dental work done, tell your dentist you are receiving this medicine. °Do not become pregnant while taking this medicine or for 5 months after   stopping it. Women should inform their doctor if they wish to become pregnant or think they might be pregnant. There is a potential for serious side effects to an unborn child. Talk to your health care professional or pharmacist for more information. What side effects may I  notice from receiving this medicine? Side effects that you should report to your doctor or health care professional as soon as possible: -allergic reactions like skin rash, itching or hives, swelling of the face, lips, or tongue -breathing problems -chest pain -fast, irregular heartbeat -feeling faint or lightheaded, falls -fever, chills, or any other sign of infection -muscle spasms, tightening, or twitches -numbness or tingling -skin blisters or bumps, or is dry, peels, or red -slow healing or unexplained pain in the mouth or jaw -unusual bleeding or bruising Side effects that usually do not require medical attention (Report these to your doctor or health care professional if they continue or are bothersome.): -muscle pain -stomach upset, gas This list may not describe all possible side effects. Call your doctor for medical advice about side effects. You may report side effects to FDA at 1-800-FDA-1088. Where should I keep my medicine? This medicine is only given in a clinic, doctor's office, or other health care setting and will not be stored at home. NOTE: This sheet is a summary. It may not cover all possible information. If you have questions about this medicine, talk to your doctor, pharmacist, or health care provider.  2014, Elsevier/Gold Standard. (2012-04-10 12:37:47)

## 2013-12-17 NOTE — Progress Notes (Signed)
   58 year old who presented to the office today to discuss her recent bone density study that had been ordered after her annual gynecological exam. Patient many years ago was a chronic smoker. Her mother has history of osteoporosis. Bone density study done here in office this year and was compared with 2012 with the following results:  AP spine T score of -3.1 (of -9.6% decrease bone mineralization) Left hip -10.4% decrease bone mineralization Right hip -5.0% decrease bone mineralization  Patient's bone masses 25% below normal with a risk of spinal fracture a times greater than the normal population at her age and 11 times greater risk of a hip fracture in comparison to the normal population.  A calcium, vitamin D, and PTH level was drawn before today's visit more on the normal range.  We had a lengthy discussion on osteoporosis as well as treatment options to include:Reclast, Prolia, Actonel, Boniva, Forteo, and Fosamax. Patient would like to go on the Prolia which is a monoclonal antibody 60 mg subcutaneous every 6 months. Risks to include spontaneous fracture of the hip as well as osteonecrosis of the jaw had been discussed with the patient. Patient will continue with calcium 1200 mg daily along with vitamin D 3 2000 units daily and will repeat her bone density study in one year after initiating treatment.

## 2013-12-25 ENCOUNTER — Telehealth: Payer: Self-pay | Admitting: *Deleted

## 2013-12-25 NOTE — Telephone Encounter (Signed)
Pt questioned if should wait for the dental implant process to be complete before starting Prolia. I advised yes and to get clearance from peridontist to proceed with the injections. I will contact back with benefits of Prolia so she is aware.

## 2013-12-31 NOTE — Telephone Encounter (Signed)
Pt informed of her benefits for prolia. SHe has a deductible of $1500, after met it is covered at 80%. Pt responsible for 20%. No PA needed. Pt will call back with how she wants to proceed. KW CMA

## 2013-12-31 NOTE — Telephone Encounter (Signed)
LM for pt to call back for Prolia benefits. KW CMA 

## 2014-02-01 ENCOUNTER — Encounter: Payer: Self-pay | Admitting: Women's Health

## 2014-02-01 ENCOUNTER — Ambulatory Visit (INDEPENDENT_AMBULATORY_CARE_PROVIDER_SITE_OTHER): Payer: 59 | Admitting: Women's Health

## 2014-02-01 DIAGNOSIS — F411 Generalized anxiety disorder: Secondary | ICD-10-CM

## 2014-02-01 MED ORDER — ALPRAZOLAM 0.25 MG PO TABS: 0.2500 mg | ORAL_TABLET | Freq: Every evening | ORAL | Status: DC | PRN

## 2014-02-01 NOTE — Patient Instructions (Signed)
Stress Management Stress is a state of physical or mental tension that often results from changes in your life or normal routine. Some common causes of stress are:  Death of a loved one.  Injuries or severe illnesses.  Getting fired or changing jobs.  Moving into a new home. Other causes may be:  Sexual problems.  Business or financial losses.  Taking on a large debt.  Regular conflict with someone at home or at work.  Constant tiredness from lack of sleep. It is not just bad things that are stressful. It may be stressful to:  Win the lottery.  Get married.  Buy a new car. The amount of stress that can be easily tolerated varies from person to person. Changes generally cause stress, regardless of the types of change. Too much stress can affect your health. It may lead to physical or emotional problems. Too little stress (boredom) may also become stressful. SUGGESTIONS TO REDUCE STRESS:  Talk things over with your family and friends. It often is helpful to share your concerns and worries. If you feel your problem is serious, you may want to get help from a professional counselor.  Consider your problems one at a time instead of lumping them all together. Trying to take care of everything at once may seem impossible. List all the things you need to do and then start with the most important one. Set a goal to accomplish 2 or 3 things each day. If you expect to do too many in a single day you will naturally fail, causing you to feel even more stressed.  Do not use alcohol or drugs to relieve stress. Although you may feel better for a short time, they do not remove the problems that caused the stress. They can also be habit forming.  Exercise regularly - at least 3 times per week. Physical exercise can help to relieve that "uptight" feeling and will relax you.  The shortest distance between despair and hope is often a good night's sleep.  Go to bed and get up on time allowing  yourself time for appointments without being rushed.  Take a short "time-out" period from any stressful situation that occurs during the day. Close your eyes and take some deep breaths. Starting with the muscles in your face, tense them, hold it for a few seconds, then relax. Repeat this with the muscles in your neck, shoulders, hand, stomach, back and legs.  Take good care of yourself. Eat a balanced diet and get plenty of rest.  Schedule time for having fun. Take a break from your daily routine to relax. HOME CARE INSTRUCTIONS   Call if you feel overwhelmed by your problems and feel you can no longer manage them on your own.  Return immediately if you feel like hurting yourself or someone else. Document Released: 04/06/2001 Document Revised: 01/03/2012 Document Reviewed: 06/05/2013 Exit[employer] Patient Information 2014 [employer].  

## 2014-02-01 NOTE — Progress Notes (Signed)
Patient ID: Sabrina Lee, female   DOB: 03-02-1956, 58 y.o.   MRN: [id number] Presents with complaint of inability to sleep. States has not slept for greater than one week due to worry. States husband laid off from  long-term job which will most likely necessitate a move due to noncompete, daughter having situational stress with family, states feels overwhelmed with situation.  Exam: Tearful  Situational stress causing insomnia  Plan: Options to discuss with counselor were reviewed, Xanax 0.25 at bedtime as needed for rest. Reviewed addictive properties to use sparingly. Instructed to call if no relief of insomnia.

## 2014-05-07 LAB — HM COLONOSCOPY

## 2014-05-13 ENCOUNTER — Encounter: Payer: Self-pay | Admitting: *Deleted

## 2014-07-08 ENCOUNTER — Encounter: Payer: Self-pay | Admitting: Women's Health

## 2014-07-08 ENCOUNTER — Other Ambulatory Visit: Payer: Self-pay | Admitting: Women's Health

## 2014-07-08 DIAGNOSIS — F411 Generalized anxiety disorder: Secondary | ICD-10-CM

## 2014-07-08 MED ORDER — ALPRAZOLAM 0.25 MG PO TABS: 0.2500 mg | ORAL_TABLET | Freq: Every evening | ORAL | Status: DC | PRN

## 2014-08-26 ENCOUNTER — Encounter: Payer: Self-pay | Admitting: Women's Health

## 2014-09-06 ENCOUNTER — Other Ambulatory Visit: Payer: Self-pay

## 2014-09-06 DIAGNOSIS — Z1231 Encounter for screening mammogram for malignant neoplasm of breast: Secondary | ICD-10-CM

## 2014-09-16 ENCOUNTER — Ambulatory Visit: Payer: 59

## 2014-09-17 ENCOUNTER — Ambulatory Visit
Admission: RE | Admit: 2014-09-17 | Discharge: 2014-09-17 | Disposition: A | Discharge status: Home / Self Care | Payer: BC Managed Care – PPO | Source: Ambulatory Visit

## 2014-09-17 DIAGNOSIS — Z1231 Encounter for screening mammogram for malignant neoplasm of breast: Secondary | ICD-10-CM

## 2014-09-18 ENCOUNTER — Encounter: Payer: Self-pay | Admitting: Women's Health

## 2014-09-26 ENCOUNTER — Encounter: Payer: Self-pay | Admitting: Women's Health

## 2014-09-26 ENCOUNTER — Ambulatory Visit (INDEPENDENT_AMBULATORY_CARE_PROVIDER_SITE_OTHER): Payer: BC Managed Care – PPO | Admitting: Women's Health

## 2014-09-26 VITALS — BP 132/84 | Ht 65.0 in | Wt 158.0 lb

## 2014-09-26 DIAGNOSIS — Z13220 Encounter for screening for lipoid disorders: Secondary | ICD-10-CM

## 2014-09-26 DIAGNOSIS — Z01419 Encounter for gynecological examination (general) (routine) without abnormal findings: Secondary | ICD-10-CM

## 2014-09-26 DIAGNOSIS — M810 Age-related osteoporosis without current pathological fracture: Secondary | ICD-10-CM

## 2014-09-26 LAB — COMPREHENSIVE METABOLIC PANEL
ALBUMIN: 4.2 g/dL (ref 3.5–5.2)
ALT: 20 U/L (ref 0–35)
AST: 19 U/L (ref 0–37)
Alkaline Phosphatase: 69 U/L (ref 39–117)
BUN: 16 mg/dL (ref 6–23)
CALCIUM: 9.8 mg/dL (ref 8.4–10.5)
CHLORIDE: 103 meq/L (ref 96–112)
CO2: 27 meq/L (ref 19–32)
Creat: 0.73 mg/dL (ref 0.50–1.10)
Glucose, Bld: 91 mg/dL (ref 70–99)
Potassium: 4.8 mEq/L (ref 3.5–5.3)
SODIUM: 139 meq/L (ref 135–145)
TOTAL PROTEIN: 6.8 g/dL (ref 6.0–8.3)
Total Bilirubin: 0.5 mg/dL (ref 0.2–1.2)

## 2014-09-26 LAB — CBC WITH DIFFERENTIAL/PLATELET
Basophils Absolute: 0 10*3/uL (ref 0.0–0.1)
Basophils Relative: 1 % (ref 0–1)
EOS ABS: 0.1 10*3/uL (ref 0.0–0.7)
Eosinophils Relative: 3 % (ref 0–5)
HEMATOCRIT: 42.8 % (ref 36.0–46.0)
HEMOGLOBIN: 14.1 g/dL (ref 12.0–15.0)
LYMPHS ABS: 1.5 10*3/uL (ref 0.7–4.0)
Lymphocytes Relative: 34 % (ref 12–46)
MCH: 27.1 pg (ref 26.0–34.0)
MCHC: 32.9 g/dL (ref 30.0–36.0)
MCV: 82.3 fL (ref 78.0–100.0)
MPV: 8.7 fL — ABNORMAL LOW (ref 9.4–12.4)
Monocytes Absolute: 0.4 10*3/uL (ref 0.1–1.0)
Monocytes Relative: 8 % (ref 3–12)
NEUTROS PCT: 54 % (ref 43–77)
Neutro Abs: 2.4 10*3/uL (ref 1.7–7.7)
Platelets: 256 10*3/uL (ref 150–400)
RBC: 5.2 MIL/uL — ABNORMAL HIGH (ref 3.87–5.11)
RDW: 14.6 % (ref 11.5–15.5)
WBC: 4.5 10*3/uL (ref 4.0–10.5)

## 2014-09-26 LAB — URINALYSIS W MICROSCOPIC + REFLEX CULTURE
BACTERIA UA: NONE SEEN
Bilirubin Urine: NEGATIVE
CRYSTALS: NONE SEEN
Casts: NONE SEEN
Glucose, UA: NEGATIVE mg/dL
Hgb urine dipstick: NEGATIVE
Ketones, ur: NEGATIVE mg/dL
Leukocytes, UA: NEGATIVE
NITRITE: NEGATIVE
PROTEIN: NEGATIVE mg/dL
SQUAMOUS EPITHELIAL / LPF: NONE SEEN
Urobilinogen, UA: 0.2 mg/dL (ref 0.0–1.0)
pH: 7 (ref 5.0–8.0)

## 2014-09-26 LAB — LIPID PANEL
CHOLESTEROL: 240 mg/dL — AB (ref 0–200)
HDL: 80 mg/dL (ref >39)
LDL Cholesterol: 146 mg/dL — ABNORMAL HIGH (ref 0–99)
TRIGLYCERIDES: 70 mg/dL (ref <150)
Total CHOL/HDL Ratio: 3 Ratio
VLDL: 14 mg/dL (ref 0–40)

## 2014-09-26 MED ORDER — ALENDRONATE SODIUM 70 MG PO TABS: 70.0000 mg | ORAL_TABLET | ORAL | Status: DC

## 2014-09-26 NOTE — Patient Instructions (Signed)

## 2014-09-26 NOTE — Progress Notes (Signed)
[place] [birth year] [id number]    History:    Presents for annual exam.  Postmenopausal/no HRT/no bleeding. Normal Pap and mammogram history. Bone density 11/2013 T score -3.1 spine, -2.5 left femoral neck had been recommended to start Prolia but did not do during numerous dental procedures. 2015 benign colon polyp. Has had some problems with anxiety in the past/ stable, rare Xanax use.  Past medical history, past surgical history, family history and social history were all reviewed and documented in the EPIC chart. Retired, in the process of moving to [place]. Daughter has a baby, doing well. Father mental health issues, mother heart disease.  ROS:  A  12 point ROS was performed and pertinent positives and negatives are included.  Exam:  Filed Vitals:   09/26/14 0857  BP: 132/84    General appearance:  Normal Thyroid:  Symmetrical, normal in size, without palpable masses or nodularity. Respiratory  Auscultation:  Clear without wheezing or rhonchi Cardiovascular  Auscultation:  Regular rate, without rubs, murmurs or gallops  Edema/varicosities:  Not grossly evident Abdominal  Soft,nontender, without masses, guarding or rebound.  Liver/spleen:  No organomegaly noted  Hernia:  None appreciated  Skin  Inspection:  Grossly normal   Breasts: Examined lying and sitting/breast reduction.     Right: Without masses, retractions, discharge or axillary adenopathy.     Left: Without masses, retractions, discharge or axillary adenopathy. Gentitourinary   Inguinal/mons:  Normal without inguinal adenopathy  External genitalia:  Normal  BUS/Urethra/Skene's glands:  Normal  Vagina:  Atrophic Cervix:  Normal  Uterus:   normal in size, shape and contour.  Midline and mobile  Adnexa/parametria:     Rt: Without masses or tenderness.   Lt: Without masses or tenderness.  Anus and perineum: Normal  Digital rectal exam: Normal sphincter tone without palpated masses or  tenderness  Assessment/Plan:  58 y.o. M WF G2 P2  for annual exam.    Postmenopausal/no HRT/no bleeding Atrophic vaginitis Osteoporosis  Plan: Osteoporosis treatment reviewed, will try Fosamax 70 mg by mouth weekly, proper administration, importance of regular weightbearing exercise, calcium rich diet, vitamin D 2000 daily continue. Home safety fall prevention discussed. Reviewed Prolia, states would rather try Fosamax first. SBE's, continue annual mammogram, 3-D tomography reviewed and encouraged history of dense breast. CBC, comprehensive metabolic panel, lipid panel, UA, Pap normal 2014, new screening guidelines reviewed. Vaginal lubricants reviewed.    Huel Cote WHNP, 1:14 PM 09/26/2014

## 2014-09-27 ENCOUNTER — Encounter: Payer: Self-pay | Admitting: Women's Health

## 2014-12-02 ENCOUNTER — Encounter: Payer: Self-pay | Admitting: Women's Health

## 2015-08-13 ENCOUNTER — Other Ambulatory Visit: Payer: Self-pay

## 2015-08-13 DIAGNOSIS — Z1231 Encounter for screening mammogram for malignant neoplasm of breast: Secondary | ICD-10-CM

## 2015-10-02 ENCOUNTER — Ambulatory Visit
Admission: RE | Admit: 2015-10-02 | Discharge: 2015-10-02 | Disposition: A | Discharge status: Home / Self Care | Payer: BLUE CROSS/BLUE SHIELD | Source: Ambulatory Visit

## 2015-10-02 DIAGNOSIS — Z1231 Encounter for screening mammogram for malignant neoplasm of breast: Secondary | ICD-10-CM

## 2015-10-03 ENCOUNTER — Other Ambulatory Visit (HOSPITAL_COMMUNITY)
Admission: RE | Admit: 2015-10-03 | Discharge: 2015-10-03 | Disposition: A | Discharge status: Home / Self Care | Payer: BLUE CROSS/BLUE SHIELD | Source: Ambulatory Visit | Attending: Women's Health | Admitting: Women's Health

## 2015-10-03 ENCOUNTER — Ambulatory Visit (INDEPENDENT_AMBULATORY_CARE_PROVIDER_SITE_OTHER): Payer: BLUE CROSS/BLUE SHIELD | Admitting: Women's Health

## 2015-10-03 ENCOUNTER — Encounter: Payer: Self-pay | Admitting: Women's Health

## 2015-10-03 VITALS — BP 128/80 | Ht 65.0 in | Wt 159.0 lb

## 2015-10-03 DIAGNOSIS — Z13220 Encounter for screening for lipoid disorders: Secondary | ICD-10-CM

## 2015-10-03 DIAGNOSIS — Z01419 Encounter for gynecological examination (general) (routine) without abnormal findings: Secondary | ICD-10-CM | POA: Diagnosis present

## 2015-10-03 DIAGNOSIS — M810 Age-related osteoporosis without current pathological fracture: Secondary | ICD-10-CM

## 2015-10-03 DIAGNOSIS — Z1151 Encounter for screening for human papillomavirus (HPV): Secondary | ICD-10-CM | POA: Insufficient documentation

## 2015-10-03 DIAGNOSIS — Z1329 Encounter for screening for other suspected endocrine disorder: Secondary | ICD-10-CM

## 2015-10-03 LAB — COMPREHENSIVE METABOLIC PANEL
ALBUMIN: 4.5 g/dL (ref 3.6–5.1)
ALT: 18 U/L (ref 6–29)
AST: 17 U/L (ref 10–35)
Alkaline Phosphatase: 60 U/L (ref 33–130)
BUN: 16 mg/dL (ref 7–25)
CALCIUM: 9.2 mg/dL (ref 8.6–10.4)
CHLORIDE: 106 mmol/L (ref 98–110)
CO2: 23 mmol/L (ref 20–31)
Creat: 0.79 mg/dL (ref 0.50–1.05)
GLUCOSE: 102 mg/dL — AB (ref 65–99)
POTASSIUM: 4.4 mmol/L (ref 3.5–5.3)
Sodium: 139 mmol/L (ref 135–146)
Total Bilirubin: 0.4 mg/dL (ref 0.2–1.2)
Total Protein: 7 g/dL (ref 6.1–8.1)

## 2015-10-03 LAB — TSH: TSH: 0.694 u[IU]/mL (ref 0.350–4.500)

## 2015-10-03 LAB — CBC WITH DIFFERENTIAL/PLATELET
Basophils Absolute: 0 10*3/uL (ref 0.0–0.1)
Basophils Relative: 0 % (ref 0–1)
EOS PCT: 5 % (ref 0–5)
Eosinophils Absolute: 0.2 10*3/uL (ref 0.0–0.7)
HEMATOCRIT: 44.5 % (ref 36.0–46.0)
HEMOGLOBIN: 14.6 g/dL (ref 12.0–15.0)
LYMPHS PCT: 20 % (ref 12–46)
Lymphs Abs: 1 10*3/uL (ref 0.7–4.0)
MCH: 27 pg (ref 26.0–34.0)
MCHC: 32.8 g/dL (ref 30.0–36.0)
MCV: 82.3 fL (ref 78.0–100.0)
MONO ABS: 0.2 10*3/uL (ref 0.1–1.0)
MONOS PCT: 5 % (ref 3–12)
MPV: 9 fL (ref 8.6–12.4)
Neutro Abs: 3.4 10*3/uL (ref 1.7–7.7)
Neutrophils Relative %: 70 % (ref 43–77)
Platelets: 312 10*3/uL (ref 150–400)
RBC: 5.41 MIL/uL — ABNORMAL HIGH (ref 3.87–5.11)
RDW: 14.4 % (ref 11.5–15.5)
WBC: 4.9 10*3/uL (ref 4.0–10.5)

## 2015-10-03 LAB — LIPID PANEL
CHOL/HDL RATIO: 2.7 ratio (ref <=5.0)
Cholesterol: 233 mg/dL — ABNORMAL HIGH (ref 125–200)
HDL: 87 mg/dL (ref >=46)
LDL CALC: 134 mg/dL — AB (ref <130)
TRIGLYCERIDES: 60 mg/dL (ref <150)
VLDL: 12 mg/dL (ref <30)

## 2015-10-03 MED ORDER — ALENDRONATE SODIUM 70 MG PO TABS: 70.0000 mg | ORAL_TABLET | ORAL | Status: DC

## 2015-10-03 NOTE — Progress Notes (Signed)
[place] [birth year] [id number]    History:    Presents for annual exam.  Postmenopausal/no HRT/no bleeding. 2015 T score -3.1 spine -2.5 femoral neck on Fosamax. Tolerating well. Normal Pap and mammogram history. 2015 benign colon polyps. Normal vitamin D level.  Past medical history, past surgical history, family history and social history were all reviewed and documented in the EPIC chart. Retired, moved to [employer]. Mother heart disease, father mental illness.  ROS:  A ROS was performed and pertinent positives and negatives are included.  Exam:  Filed Vitals:   10/03/15 0928  BP: 128/80    General appearance:  Normal Thyroid:  Symmetrical, normal in size, without palpable masses or nodularity. Respiratory  Auscultation:  Clear without wheezing or rhonchi Cardiovascular  Auscultation:  Regular rate, without rubs, murmurs or gallops  Edema/varicosities:  Not grossly evident Abdominal  Soft,nontender, without masses, guarding or rebound.  Liver/spleen:  No organomegaly noted  Hernia:  None appreciated  Skin  Inspection:  Grossly normal   Breasts: Examined lying and sitting.     Right: Without masses, retractions, discharge or axillary adenopathy.     Left: Without masses, retractions, discharge or axillary adenopathy. Gentitourinary   Inguinal/mons:  Normal without inguinal adenopathy  External genitalia:  Normal  BUS/Urethra/Skene's glands:  Normal  Vagina:  Normal  Cervix:  Normal  Uterus:   normal in size, shape and contour.  Midline and mobile  Adnexa/parametria:     Rt: Without masses or tenderness.   Lt: Without masses or tenderness.  Anus and perineum: Normal  Digital rectal exam: Normal sphincter tone without palpated masses or tenderness  Assessment/Plan:  59 y.o. MWF G2 P2  for annual exam complaining of difficulty sleeping.  Postmenopausal/no HRT/no bleeding Osteoporosis on Fosamax tolerating well Insomnia  Plan: Insomnia/sleep hygiene  reviewed, Ambien 10 mg at bedtime when necessary #30 prescription given. Fosamax 70 mg by mouth weekly prescription, proper use given and reviewed. SBE's, continue annual  3-D tomography encouraged history of breast density. Had mammogram today. Home safety, fall prevention and importance of regular weightbearing exercise reviewed. Vitamin D 1000 daily, calcium rich diet encouraged. CBC, TSH, vitamin D, CMP, lipid panel, UA, Pap with HR HPV typing, new screening guidelines reviewed.    [place], 1:43 PM 10/03/2015

## 2015-10-03 NOTE — Patient Instructions (Signed)

## 2015-10-04 LAB — URINALYSIS W MICROSCOPIC + REFLEX CULTURE
BACTERIA UA: NONE SEEN [HPF]
Bilirubin Urine: NEGATIVE
CASTS: NONE SEEN [LPF]
CRYSTALS: NONE SEEN [HPF]
Glucose, UA: NEGATIVE
Hgb urine dipstick: NEGATIVE
Ketones, ur: NEGATIVE
Leukocytes, UA: NEGATIVE
Nitrite: NEGATIVE
Protein, ur: NEGATIVE
RBC / HPF: NONE SEEN RBC/HPF (ref <=2)
SQUAMOUS EPITHELIAL / LPF: NONE SEEN [HPF] (ref <=5)
Specific Gravity, Urine: 1.019 (ref 1.001–1.035)
WBC, UA: NONE SEEN WBC/HPF (ref <=5)
Yeast: NONE SEEN [HPF]
pH: 7 (ref 5.0–8.0)

## 2015-10-04 LAB — VITAMIN D 25 HYDROXY (VIT D DEFICIENCY, FRACTURES): Vit D, 25-Hydroxy: 40 ng/mL (ref 30–100)

## 2015-10-06 ENCOUNTER — Encounter: Payer: Self-pay | Admitting: Women's Health

## 2015-10-06 LAB — CYTOLOGY - PAP

## 2015-12-11 ENCOUNTER — Encounter: Payer: Self-pay | Admitting: Women's Health

## 2015-12-15 ENCOUNTER — Other Ambulatory Visit: Payer: Self-pay | Admitting: *Deleted

## 2015-12-15 ENCOUNTER — Encounter: Payer: Self-pay | Admitting: Women's Health

## 2015-12-15 DIAGNOSIS — M810 Age-related osteoporosis without current pathological fracture: Secondary | ICD-10-CM

## 2015-12-15 MED ORDER — ALENDRONATE SODIUM 70 MG PO TABS: 70.0000 mg | ORAL_TABLET | ORAL | Status: AC

## 2016-04-12 ENCOUNTER — Other Ambulatory Visit: Payer: Self-pay

## 2016-04-12 ENCOUNTER — Encounter: Payer: Self-pay | Admitting: Women's Health

## 2016-04-12 MED ORDER — ZOLPIDEM TARTRATE 10 MG PO TABS: 10.0000 mg | ORAL_TABLET | Freq: Every evening | ORAL | Status: DC | PRN

## 2016-04-12 NOTE — Telephone Encounter (Signed)
Okay for refill, it looks like she had one prescription 1 year ago only

## 2016-04-12 NOTE — Telephone Encounter (Signed)
rx sent. Patient informed.

## 2016-07-28 ENCOUNTER — Other Ambulatory Visit: Payer: Self-pay | Admitting: Women's Health

## 2016-07-28 MED ORDER — ZOLPIDEM TARTRATE 10 MG PO TABS: 10.0000 mg | ORAL_TABLET | Freq: Every evening | ORAL | 1 refills | Status: AC | PRN

## 2016-07-28 NOTE — Telephone Encounter (Signed)
Okay for refill?  

## 2016-07-28 NOTE — Telephone Encounter (Signed)
Rx called in to pharmacy. 

## 2016-11-23 ENCOUNTER — Other Ambulatory Visit: Payer: Self-pay | Admitting: Pediatrics

## 2017-03-09 ENCOUNTER — Encounter: Payer: Self-pay | Admitting: Gynecology
